# Patient Record
Sex: Male | Born: 1985 | Race: White | Hispanic: No | Marital: Single | Smoking: Current every day smoker
Health system: Southern US, Community
[De-identification: ages and names within clinical notes are randomized; demographics above are authoritative.]

## PROBLEM LIST (undated history)

## (undated) DIAGNOSIS — K219 Gastro-esophageal reflux disease without esophagitis: Secondary | ICD-10-CM

## (undated) DIAGNOSIS — F988 Other specified behavioral and emotional disorders with onset usually occurring in childhood and adolescence: Secondary | ICD-10-CM

## (undated) HISTORY — PX: CHOLECYSTECTOMY: SHX55

---

## 2016-05-22 ENCOUNTER — Emergency Department (HOSPITAL_BASED_OUTPATIENT_CLINIC_OR_DEPARTMENT_OTHER)
Admission: EM | Admit: 2016-05-22 | Discharge: 2016-05-22 | Disposition: A | Discharge status: Home / Self Care | Payer: Managed Care, Other (non HMO) | Attending: Emergency Medicine | Admitting: Emergency Medicine

## 2016-05-22 ENCOUNTER — Encounter (HOSPITAL_BASED_OUTPATIENT_CLINIC_OR_DEPARTMENT_OTHER): Payer: Self-pay

## 2016-05-22 ENCOUNTER — Emergency Department (HOSPITAL_BASED_OUTPATIENT_CLINIC_OR_DEPARTMENT_OTHER): Payer: Managed Care, Other (non HMO)

## 2016-05-22 DIAGNOSIS — S40812A Abrasion of left upper arm, initial encounter: Secondary | ICD-10-CM | POA: Diagnosis not present

## 2016-05-22 DIAGNOSIS — F17200 Nicotine dependence, unspecified, uncomplicated: Secondary | ICD-10-CM | POA: Diagnosis not present

## 2016-05-22 DIAGNOSIS — S301XXA Contusion of abdominal wall, initial encounter: Secondary | ICD-10-CM

## 2016-05-22 DIAGNOSIS — S0081XA Abrasion of other part of head, initial encounter: Secondary | ICD-10-CM | POA: Insufficient documentation

## 2016-05-22 DIAGNOSIS — S80812A Abrasion, left lower leg, initial encounter: Secondary | ICD-10-CM | POA: Diagnosis not present

## 2016-05-22 DIAGNOSIS — Y939 Activity, unspecified: Secondary | ICD-10-CM | POA: Insufficient documentation

## 2016-05-22 DIAGNOSIS — T07XXXA Unspecified multiple injuries, initial encounter: Secondary | ICD-10-CM

## 2016-05-22 DIAGNOSIS — S6991XA Unspecified injury of right wrist, hand and finger(s), initial encounter: Secondary | ICD-10-CM | POA: Diagnosis present

## 2016-05-22 DIAGNOSIS — Z79899 Other long term (current) drug therapy: Secondary | ICD-10-CM | POA: Insufficient documentation

## 2016-05-22 DIAGNOSIS — Y92410 Unspecified street and highway as the place of occurrence of the external cause: Secondary | ICD-10-CM | POA: Diagnosis not present

## 2016-05-22 DIAGNOSIS — S50811A Abrasion of right forearm, initial encounter: Secondary | ICD-10-CM | POA: Diagnosis not present

## 2016-05-22 DIAGNOSIS — S60511A Abrasion of right hand, initial encounter: Secondary | ICD-10-CM | POA: Insufficient documentation

## 2016-05-22 DIAGNOSIS — Y999 Unspecified external cause status: Secondary | ICD-10-CM | POA: Insufficient documentation

## 2016-05-22 HISTORY — DX: Other specified behavioral and emotional disorders with onset usually occurring in childhood and adolescence: F98.8

## 2016-05-22 HISTORY — DX: Gastro-esophageal reflux disease without esophagitis: K21.9

## 2016-05-22 LAB — CBC WITH DIFFERENTIAL/PLATELET
BASOS PCT: 0 %
Basophils Absolute: 0 10*3/uL (ref 0.0–0.1)
EOS PCT: 0 %
Eosinophils Absolute: 0 10*3/uL (ref 0.0–0.7)
HEMATOCRIT: 39.6 % (ref 39.0–52.0)
Hemoglobin: 14.5 g/dL (ref 13.0–17.0)
LYMPHS ABS: 3.3 10*3/uL (ref 0.7–4.0)
Lymphocytes Relative: 15 %
MCH: 32.2 pg (ref 26.0–34.0)
MCHC: 36.6 g/dL — ABNORMAL HIGH (ref 30.0–36.0)
MCV: 88 fL (ref 78.0–100.0)
MONO ABS: 1.1 10*3/uL — AB (ref 0.1–1.0)
MONOS PCT: 5 %
NEUTROS PCT: 80 %
Neutro Abs: 17.6 10*3/uL — ABNORMAL HIGH (ref 1.7–7.7)
Platelets: 205 10*3/uL (ref 150–400)
RBC: 4.5 MIL/uL (ref 4.22–5.81)
RDW: 17.6 % — ABNORMAL HIGH (ref 11.5–15.5)
WBC: 22 10*3/uL — AB (ref 4.0–10.5)

## 2016-05-22 LAB — COMPREHENSIVE METABOLIC PANEL
ALBUMIN: 4.8 g/dL (ref 3.5–5.0)
ALK PHOS: 58 U/L (ref 38–126)
ALT: 16 U/L — AB (ref 17–63)
AST: 21 U/L (ref 15–41)
Anion gap: 11 (ref 5–15)
BUN: 15 mg/dL (ref 6–20)
CALCIUM: 9 mg/dL (ref 8.9–10.3)
CHLORIDE: 103 mmol/L (ref 101–111)
CO2: 23 mmol/L (ref 22–32)
CREATININE: 0.89 mg/dL (ref 0.61–1.24)
GFR calc non Af Amer: >60 mL/min (ref >60)
GLUCOSE: 131 mg/dL — AB (ref 65–99)
Potassium: 3.2 mmol/L — ABNORMAL LOW (ref 3.5–5.1)
SODIUM: 137 mmol/L (ref 135–145)
Total Bilirubin: 3.8 mg/dL — ABNORMAL HIGH (ref 0.3–1.2)
Total Protein: 7 g/dL (ref 6.5–8.1)

## 2016-05-22 LAB — LIPASE, BLOOD: Lipase: 27 U/L (ref 11–51)

## 2016-05-22 MED ORDER — SILVER SULFADIAZINE 1 % EX CREA
TOPICAL_CREAM | Freq: Every day | CUTANEOUS | Status: DC
  Administered 2016-05-22: 1 via TOPICAL
  Filled 2016-05-22: qty 85

## 2016-05-22 MED ORDER — IOPAMIDOL (ISOVUE-300) INJECTION 61%
100.0000 mL | Freq: Once | INTRAVENOUS | Status: AC | PRN
  Administered 2016-05-22: 100 mL via INTRAVENOUS
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
## 2016-05-22 NOTE — Discharge Instructions (Signed)
Your white blood cell count was elevated at 22,000. This needs to be rechecked by her primary care provider.
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
